# Patient Record
Sex: Male | Born: 1975 | Race: White | Hispanic: No | Marital: Single | Smoking: Current every day smoker
Health system: Southern US, Community
[De-identification: ages and names within clinical notes are randomized; demographics above are authoritative.]

## PROBLEM LIST (undated history)

## (undated) HISTORY — PX: SALIVARY GLAND SURGERY: SHX768

## (undated) HISTORY — PX: ANKLE SURGERY: SHX546

## (undated) HISTORY — PX: APPENDECTOMY: SHX54

## (undated) HISTORY — PX: HAND SURGERY: SHX662

## (undated) HISTORY — PX: CHOLECYSTECTOMY: SHX55

## (undated) HISTORY — PX: FACIAL RECONSTRUCTION SURGERY: SHX631

---

## 1999-03-08 ENCOUNTER — Emergency Department (HOSPITAL_COMMUNITY): Admission: EM | Admit: 1999-03-08 | Discharge: 1999-03-08 | Payer: Self-pay | Admitting: Emergency Medicine

## 1999-06-30 ENCOUNTER — Encounter: Payer: Self-pay | Admitting: Emergency Medicine

## 1999-06-30 ENCOUNTER — Emergency Department (HOSPITAL_COMMUNITY): Admission: EM | Admit: 1999-06-30 | Discharge: 1999-06-30 | Payer: Self-pay | Admitting: Emergency Medicine

## 2015-01-25 ENCOUNTER — Encounter (HOSPITAL_COMMUNITY): Payer: Self-pay

## 2015-01-25 ENCOUNTER — Emergency Department (HOSPITAL_COMMUNITY): Payer: BLUE CROSS/BLUE SHIELD

## 2015-01-25 ENCOUNTER — Emergency Department (HOSPITAL_COMMUNITY)
Admission: EM | Admit: 2015-01-25 | Discharge: 2015-01-25 | Disposition: A | Discharge status: Home / Self Care | Payer: BLUE CROSS/BLUE SHIELD | Attending: Emergency Medicine | Admitting: Emergency Medicine

## 2015-01-25 DIAGNOSIS — M25572 Pain in left ankle and joints of left foot: Secondary | ICD-10-CM | POA: Diagnosis present

## 2015-01-25 DIAGNOSIS — Z720 Tobacco use: Secondary | ICD-10-CM | POA: Diagnosis not present

## 2015-01-25 DIAGNOSIS — M722 Plantar fascial fibromatosis: Secondary | ICD-10-CM | POA: Diagnosis not present

## 2015-01-25 NOTE — ED Notes (Signed)
Patient returned from XR. 

## 2015-01-25 NOTE — ED Notes (Signed)
Pt has had left heel pain since last Thursday.  Pt soaked foot with no relief.  No wound visible.

## 2015-01-25 NOTE — ED Provider Notes (Signed)
CSN: [id number]     Arrival date & time 01/25/15  1831 History  This chart was scribed for non-physician practitioner, Kathrynn Speedobyn M Katyana Trolinger, PA-C, working with Lorre NickAnthony Allen, MD, by Modena JanskyAlbert Thayil, ED Scribe. This patient was seen in room WTR6/WTR6 and the patient's care was started at 7:06 PM.   Chief Complaint  Patient presents with  . Foot Pain   The history is provided by the patient. No language interpreter was used.   HPI Comments: Tony Cortez is a 39 y.o. male who presents to the Emergency Department complaining of constant moderate left foot pain that started 5 days ago. He reports that he started having left heel pain that started 5 days ago. He states that he has no known recent injury. He reports that bearing weight exacerbates the pain. He states that he has been taking motrin without any relief.   History reviewed. No pertinent past medical history. Past Surgical History  Procedure Laterality Date  . Ankle surgery    . Appendectomy    . Cholecystectomy    . Hand surgery    . Salivary gland surgery    . Facial reconstruction surgery     History reviewed. No pertinent family history. History  Substance Use Topics  . Smoking status: Current Every Day Smoker  . Smokeless tobacco: Not on file  . Alcohol Use: Yes     Comment: social    Review of Systems  Musculoskeletal:       + L foot/heel pain.  Skin: Negative for color change and wound.  Neurological: Negative for numbness.    Allergies  Keflex  Home Medications   Prior to Admission medications   Not on File   BP 122/75 mmHg  Pulse 85  Temp(Src) 97.9 F (36.6 C) (Oral)  Resp 18  SpO2 99% Physical Exam  Constitutional: He is oriented to person, place, and time. He appears well-developed and well-nourished. No distress.  HENT:  Head: Normocephalic and atraumatic.  Eyes: Conjunctivae and EOM are normal.  Neck: Normal range of motion. Neck supple.  Cardiovascular: Normal rate, regular rhythm and normal heart  sounds.   Pulmonary/Chest: Effort normal and breath sounds normal.  Musculoskeletal: Normal range of motion. He exhibits no edema.  TTP over L heel , more so medially, mild tenderness over plantar fascia. No swelling. FROM ankle, able to wiggle toes. +2 PT/DP pulse. Achilles tendon normal.  Neurological: He is alert and oriented to person, place, and time.  Skin: Skin is warm and dry.  Psychiatric: He has a normal mood and affect. His behavior is normal.  Nursing note and vitals reviewed.   ED Course  Procedures (including critical care time) DIAGNOSTIC STUDIES: Oxygen Saturation is 99% on RA, normal by my interpretation.    COORDINATION OF CARE: 7:10 PM- Pt advised of plan for treatment which includes radiology and pt agrees.  Labs Review Labs Reviewed - No data to display  Imaging Review Dg Foot Complete Left  01/25/2015   CLINICAL DATA:  Left medial calcaneus pain, nontraumatic. Duration 2 days.  EXAM: LEFT FOOT - COMPLETE 3+ VIEW  COMPARISON:  None.  FINDINGS: There is no evidence of fracture or dislocation. There is no evidence of arthropathy or other focal bone abnormality. Soft tissues are unremarkable.  IMPRESSION: Negative.   Electronically Signed   By: Ellery Plunkaniel R Mitchell M.D.   On: 01/25/2015 20:01     EKG Interpretation None      MDM   Final diagnoses:  Plantar fasciitis  of left foot   NAD. Neurovascularly intact. X-ray negative. Symptoms consistent with plantar fasciitis. Discussed symptomatic treatment. Stable for discharge. Return precautions given. Patient states understanding of treatment care plan and is agreeable.  I personally performed the services described in this documentation, which was scribed in my presence. The recorded information has been reviewed and is accurate.  Kathrynn SpeedRobyn M Ikram Riebe, PA-C 01/26/15 2126  Lorre NickAnthony Allen, MD 01/30/15 [phone]

## 2015-01-25 NOTE — Discharge Instructions (Signed)
Heel Spur °A heel spur is a hook of bone that can form on the calcaneus (the heel bone and the largest bone of the foot). Heel spurs are often associated with plantar fasciitis and usually come in people who have had the problem for an extended period of time. The cause of the relationship is unknown. The pain associated with them is thought to be caused by an inflammation (soreness and redness) of the plantar fascia rather than the spur itself. The plantar fascia is a thick fibrous like tissue that runs from the calcaneus (heel bone) to the ball of the foot. This strong, tight tissue helps maintain the arch of your foot. It helps distribute the weight across your foot as you walk or run. Stresses placed on the plantar fascia can be tremendous. When it is inflamed normal activities become painful. Pain is worse in the morning after sleeping. After sleeping the plantar fascia is tight. The first movements stretch the fascia and this causes pain. As the tendon loosens, the pain usually gets better. It often returns with too much standing or walking.  °About 70% of patients with plantar fasciitis have a heel spur. About half of people without foot pain also have heel spurs. °DIAGNOSIS  °The diagnosis of a heel spur is made by X-ray. The X-ray shows a hook of bone protruding from the bottom of the calcaneus at the point where the plantar fascia is attached to the heel bone.  °TREATMENT °· It is necessary to find out what is causing the stretching of the plantar fascia. If the cause is over-pronation (flat feet), orthotics and proper foot ware may help. °· Stretching exercises, losing weight, wearing shoes that have a cushioned heel that absorbs shock, and elevating the heel with the use of a heel cradle, heel cup, or orthotics may all help. Heel cradles and heel cups provide extra comfort and cushion to the heel, and reduce the amount of shock to the sore area. °AVOIDING THE PAIN OF PLANTAR FASCIITIS AND HEEL  SPURS °· Consult a sports medicine professional before beginning a new exercise program. °· Walking programs offer a good workout. There is a lower chance of overuse injuries common to the runners. There is less impact and less jarring of the joints. °· Begin all new exercise programs slowly. If problems or pains develop, decrease the amount of time or distance until you are at a comfortable level. °· Wear good shoes and replace them regularly. °· Stretch your foot and the heel cords at the back of the ankle (Achilles tendons) both before and after exercise. °· Run or exercise on even surfaces that are not hard. For example, asphalt is better than pavement. °· Do not run barefoot on hard surfaces. °· If using a treadmill, vary the incline. °· Do not continue to workout if you have foot or joint problems. Seek professional help if they do not improve. °HOME CARE INSTRUCTIONS  °· Avoid activities that cause you pain until you recover. °· Use ice or cold packs to the problem or painful areas after working out. °· Only take over-the-counter or prescription medicines for pain, discomfort, or fever as directed by your caregiver. °· Soft shoe inserts or athletic shoes with air or gel sole cushions may be helpful. °· If problems continue or become more severe, consult a sports medicine caregiver. Cortisone is a potent anti-inflammatory medication that may be injected into the painful area. You can discuss this treatment with your caregiver. °MAKE SURE YOU:  °·   Understand these instructions. °· Will watch your condition. °· Will get help right away if you are not doing well or get worse. °Document Released: 10/10/2005 Document Revised: 11/26/2011 Document Reviewed: 11/04/2013 °[employer]® Patient Information ©2015 [employer]. This information is not intended to replace advice given to you by your health care provider. Make sure you discuss any questions you have with your health care provider. ° °Plantar Fasciitis °Plantar  fasciitis is a common condition that causes foot pain. It is soreness (inflammation) of the band of tough fibrous tissue on the bottom of the foot that runs from the heel bone (calcaneus) to the ball of the foot. The cause of this soreness may be from excessive standing, poor fitting shoes, running on hard surfaces, being overweight, having an abnormal walk, or overuse (this is common in runners) of the painful foot or feet. It is also common in aerobic exercise dancers and ballet dancers. °SYMPTOMS  °Most people with plantar fasciitis complain of: °· Severe pain in the morning on the bottom of their foot especially when taking the first steps out of bed. This pain recedes after a few minutes of walking. °· Severe pain is experienced also during walking following a long period of inactivity. °· Pain is worse when walking barefoot or up stairs °DIAGNOSIS  °· Your caregiver will diagnose this condition by examining and feeling your foot. °· Special tests such as X-rays of your foot, are usually not needed. °PREVENTION  °· Consult a sports medicine professional before beginning a new exercise program. °· Walking programs offer a good workout. With walking there is a lower chance of overuse injuries common to runners. There is less impact and less jarring of the joints. °· Begin all new exercise programs slowly. If problems or pain develop, decrease the amount of time or distance until you are at a comfortable level. °· Wear good shoes and replace them regularly. °· Stretch your foot and the heel cords at the back of the ankle (Achilles tendon) both before and after exercise. °· Run or exercise on even surfaces that are not hard. For example, asphalt is better than pavement. °· Do not run barefoot on hard surfaces. °· If using a treadmill, vary the incline. °· Do not continue to workout if you have foot or joint problems. Seek professional help if they do not improve. °HOME CARE INSTRUCTIONS  °· Avoid activities that  cause you pain until you recover. °· Use ice or cold packs on the problem or painful areas after working out. °· Only take over-the-counter or prescription medicines for pain, discomfort, or fever as directed by your caregiver. °· Soft shoe inserts or athletic shoes with air or gel sole cushions may be helpful. °· If problems continue or become more severe, consult a sports medicine caregiver or your own health care provider. Cortisone is a potent anti-inflammatory medication that may be injected into the painful area. You can discuss this treatment with your caregiver. °MAKE SURE YOU:  °· Understand these instructions. °· Will watch your condition. °· Will get help right away if you are not doing well or get worse. °Document Released: 05/29/2001 Document Revised: 11/26/2011 Document Reviewed: 07/28/2008 °[employer]® Patient Information ©2015 [employer]. This information is not intended to replace advice given to you by your health care provider. Make sure you discuss any questions you have with your health care provider. ° °

## 2016-04-17 IMAGING — CR DG FOOT COMPLETE 3+V*L*
3 series · 3 of 3 positions shown · non-contrast
Comparison: None.

CLINICAL DATA: Left medial calcaneus pain, nontraumatic. Duration 2
days.

EXAM:
LEFT FOOT - COMPLETE 3+ VIEW

[x foot ap left]
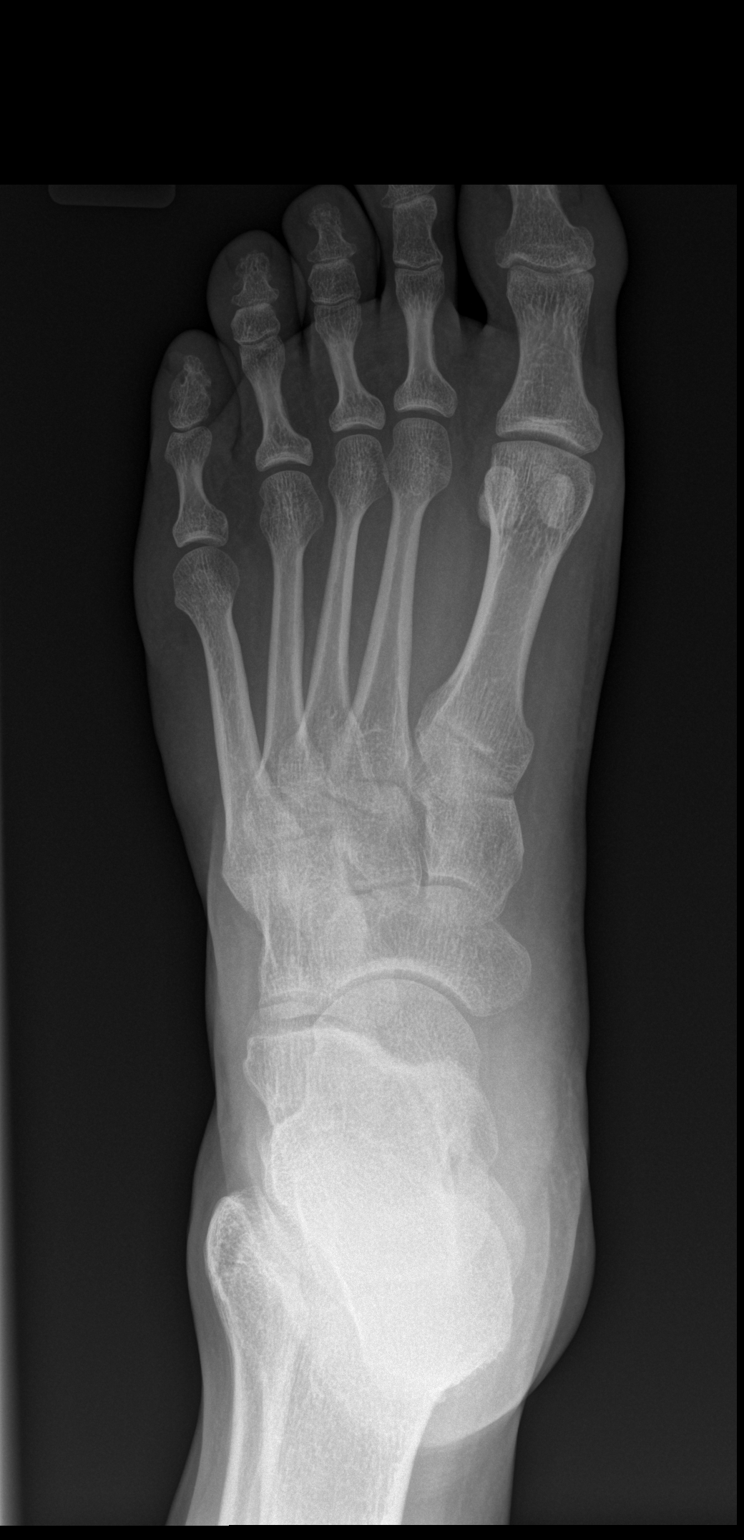

[x foot obl left]
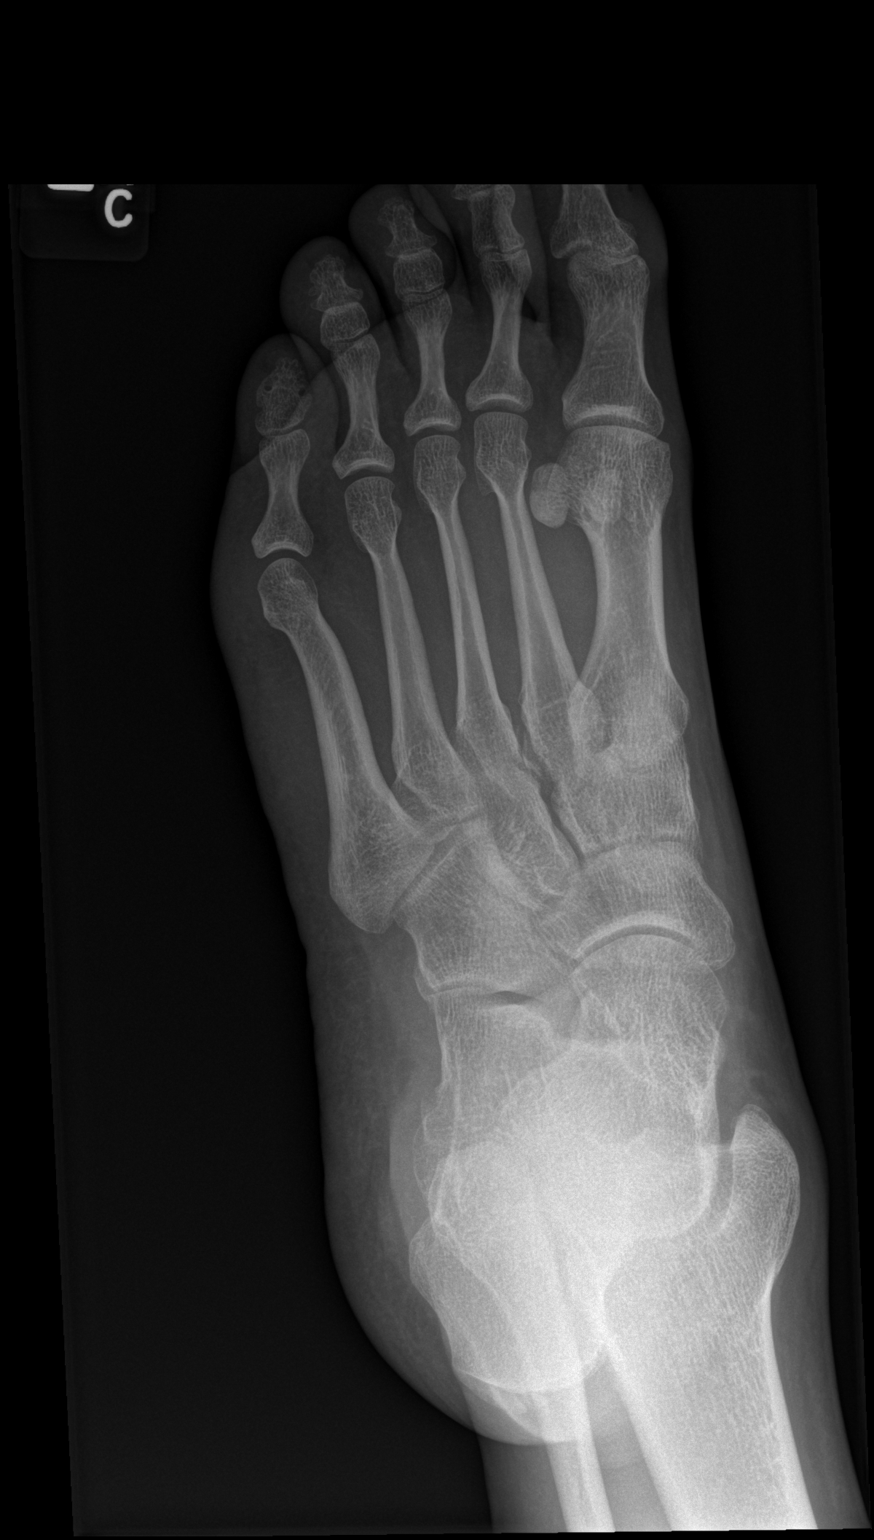

[x foot lat left]
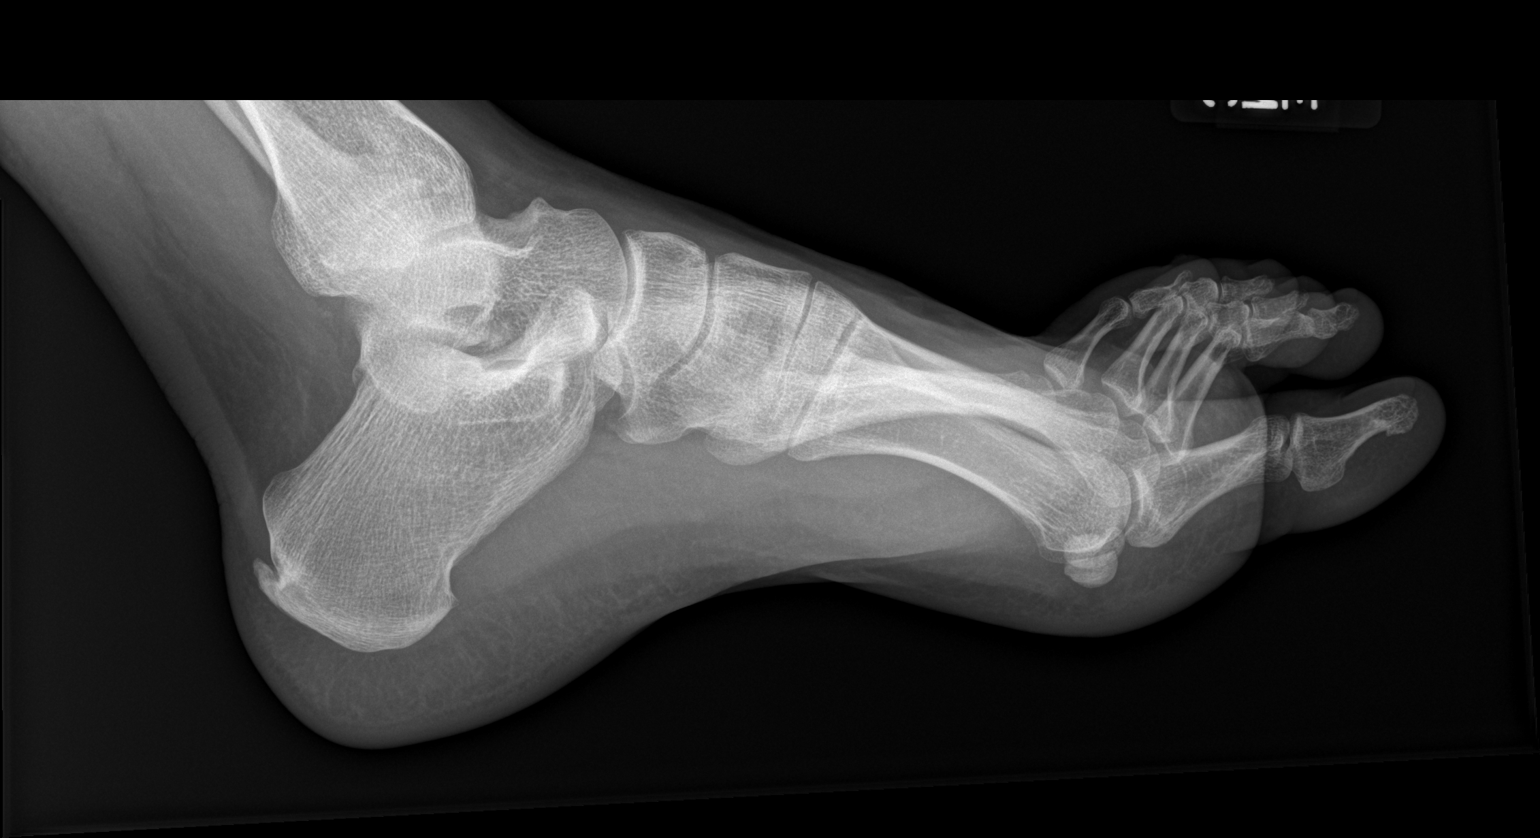

[3 of 3 positions shown; findings below may reference images not displayed]

FINDINGS: There is no evidence of fracture or dislocation. There is no
evidence of arthropathy or other focal bone abnormality. Soft
tissues are unremarkable.
IMPRESSION: Negative.
